# Patient Record
Sex: Male | Born: 1940 | Race: White | Hispanic: No | Marital: Single | Smoking: Current some day smoker
Health system: Southern US, Community
[De-identification: ages and names within clinical notes are randomized; demographics above are authoritative.]

## PROBLEM LIST (undated history)

## (undated) DIAGNOSIS — I10 Essential (primary) hypertension: Secondary | ICD-10-CM

## (undated) DIAGNOSIS — I4891 Unspecified atrial fibrillation: Secondary | ICD-10-CM

## (undated) DIAGNOSIS — C61 Malignant neoplasm of prostate: Secondary | ICD-10-CM

## (undated) HISTORY — PX: HERNIA REPAIR: SHX51

## (undated) HISTORY — PX: PAROTID GLAND TUMOR EXCISION: SHX5221

---

## 2016-09-27 ENCOUNTER — Emergency Department: Payer: Medicare HMO

## 2016-09-27 ENCOUNTER — Emergency Department
Admission: EM | Admit: 2016-09-27 | Discharge: 2016-09-27 | Disposition: A | Discharge status: Home / Self Care | Payer: Medicare HMO | Attending: Emergency Medicine | Admitting: Emergency Medicine

## 2016-09-27 ENCOUNTER — Encounter: Payer: Self-pay | Admitting: Emergency Medicine

## 2016-09-27 DIAGNOSIS — M62830 Muscle spasm of back: Secondary | ICD-10-CM

## 2016-09-27 DIAGNOSIS — R0602 Shortness of breath: Secondary | ICD-10-CM | POA: Diagnosis not present

## 2016-09-27 DIAGNOSIS — I10 Essential (primary) hypertension: Secondary | ICD-10-CM | POA: Diagnosis not present

## 2016-09-27 DIAGNOSIS — M545 Low back pain, unspecified: Secondary | ICD-10-CM

## 2016-09-27 DIAGNOSIS — J069 Acute upper respiratory infection, unspecified: Secondary | ICD-10-CM | POA: Diagnosis not present

## 2016-09-27 DIAGNOSIS — F17200 Nicotine dependence, unspecified, uncomplicated: Secondary | ICD-10-CM | POA: Insufficient documentation

## 2016-09-27 DIAGNOSIS — Z8546 Personal history of malignant neoplasm of prostate: Secondary | ICD-10-CM | POA: Diagnosis not present

## 2016-09-27 HISTORY — DX: Malignant neoplasm of prostate: C61

## 2016-09-27 HISTORY — DX: Essential (primary) hypertension: I10

## 2016-09-27 HISTORY — DX: Unspecified atrial fibrillation: I48.91

## 2016-09-27 MED ORDER — OXYCODONE-ACETAMINOPHEN 5-325 MG PO TABS: 1.0000 | ORAL_TABLET | Freq: Four times a day (QID) | ORAL | 0 refills | Status: AC | PRN

## 2016-09-27 MED ORDER — BACLOFEN 10 MG PO TABS: 10.0000 mg | ORAL_TABLET | Freq: Three times a day (TID) | ORAL | 0 refills | Status: AC | PRN

## 2016-09-27 MED ORDER — LORATADINE 10 MG PO TABS: 10.0000 mg | ORAL_TABLET | Freq: Every day | ORAL | 0 refills | Status: AC

## 2016-09-27 MED ORDER — KETOROLAC TROMETHAMINE 30 MG/ML IJ SOLN
30.0000 mg | Freq: Once | INTRAMUSCULAR | Status: AC
  Administered 2016-09-27: 30 mg via INTRAMUSCULAR
  Filled 2016-09-27: qty 1

## 2016-09-27 NOTE — ED Provider Notes (Signed)
[facility] Emergency Department Provider Note  ____________________________________________  Time seen: Approximately 11:05 AM  I have reviewed the triage vital signs and the nursing notes.   HISTORY  Chief Complaint Back Pain    HPI Nekhi Oen is a 75 y.o. male , NAD, presents to the emergency department with 1 week history of lower back pain. 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 Patient notes he has had right lower back pain over the last week that can radiate to the left. Has been taking Aleve over-the-counter which has not been helping.He stopped at an emergency department in [place] on his way to [facility] and was given hydrocodone. States that the pain worsens when he moves from lying to sitting and sitting to standing but then the pain decreases to a dull ache as he begins to ambulate. Denies any saddle paresthesias nor loss of bowel or bladder control. Has not noted any rashes or skin sores. Denies any radiation of pain. Has not had any injuries, traumas or falls. Denies any numbness, weakness, tingling. Also notes that he has had bronchitis over the last 2 weeks. States the symptoms began after he was playing golf got caught in a rainstorm and then sat in a friend's car whose air-conditioning was "stuck on high". Patient states he woke the next morning with cough and chest congestion and was seen by his primary care provider. His PCP started him on an antibiotic in which he is uncertain of the name, albuterol inhaler and prednisone in which she was taking 2 tablets by mouth daily over the last 10 days. States he finished his medications yesterday but still notes cough and chest congestion. He denies any fevers, chills nor general myalgias. Has not had any chest pain, palpitations. Denies wheezing. Has had no abdominal pain, nausea or vomiting. Denies loss of  consciousness, lightheadedness, dizziness or fatigue due to pain or cough. Has had no visual changes.   Past Medical History:  Diagnosis Date  . Atrial fibrillation ([place])   . Hypertension   . Prostate cancer ([place])     There are no active problems to display for this patient.   Past Surgical History:  Procedure Laterality Date  . HERNIA REPAIR    . PAROTID GLAND TUMOR EXCISION      Prior to Admission medications   Medication Sig Start Date End Date Taking? Authorizing Provider  baclofen (LIORESAL) 10 MG tablet Take 1 tablet (10 mg total) by mouth 3 (three) times daily as needed for muscle spasms. 09/27/16   Jami L Hagler, PA-C  loratadine (CLARITIN) 10 MG tablet Take 1 tablet (10 mg total) by mouth daily. 09/27/16   Jami L Hagler, PA-C  oxyCODONE-acetaminophen (ROXICET) 5-325 MG tablet Take 1 tablet by mouth every 6 (six) hours as needed. 09/27/16 09/27/17  Jami L Hagler, PA-C    Allergies Review of patient's allergies indicates no known allergies.  No family history on file.  Social History Social History  Substance Use Topics  . Smoking status: Current Some Day Smoker  . Smokeless tobacco: Never Used  . Alcohol use Yes     Comment: occasionally     Review of Systems  Constitutional: No fever/chills, Fatigue Eyes: No visual changes.  ENT: No sore throat, nasal congestion, runny nose, sinus pressure, ear pain. Cardiovascular: No chest pain, palpitations. Respiratory: Positive cough, chest congestion, shortness of breath. No wheezing.  Gastrointestinal:  No abdominal pain.  No nausea, vomiting.  No diarrhea.  Musculoskeletal: Positive for back pain. No neck or extremity pain. Skin: Negative for rash, skin sores. Neurological: Negative for headaches, focal weakness or numbness. No tingling. No saddle paresthesias nor loss of bowel or bladder control. No LOC, dizziness, lightheadedness. 10-point ROS otherwise  negative.  ____________________________________________   PHYSICAL EXAM:  VITAL SIGNS: ED Triage Vitals  Enc Vitals Group     BP 09/27/16 1045 130/62     Pulse Rate 09/27/16 1045 89     Resp 09/27/16 1045 18     Temp 09/27/16 1045 97.4 F (36.3 C)     Temp src --      SpO2 09/27/16 1045 93 %     Weight 09/27/16 1046 (!) 310 lb (140.6 kg)     Height 09/27/16 1046 5\' 10"  (1.778 m)     Head Circumference --      Peak Flow --      Pain Score 09/27/16 1047 7     Pain Loc --      Pain Edu? --      Excl. in [place]? --      Constitutional: Alert and oriented. Obese but well appearing and in no acute distress. Eyes: Conjunctivae are normal without icterus or injection.  Head: Atraumatic. ENT:      Ears: TMs visualized bilaterally without erythema, effusion, bulging, perforation. Bilateral external ear canals without swelling, erythema, discharge.      Nose: No congestion/rhinnorhea.      Mouth/Throat: Mucous membranes are moist. Pharynx without erythema, swelling, exudate. Uvula is midline. Moderate clear postnasal drip. Airways patent. Neck: No stridor. Supple with full range of motion. No cervical spine tenderness to palpation. Hematological/Lymphatic/Immunilogical: No cervical lymphadenopathy. Cardiovascular: Normal rate, regular rhythm. Normal S1 and S2.  No murmurs, rubs, gallops. Good peripheral circulation. Respiratory: Normal respiratory effort without tachypnea or retractions. Mild rhonchi noted throughout the right lung field without wheeze or rales. Breath sounds are noted in all lung fields. Gastrointestinal: Soft and nontender. No distention. No CVA tenderness. Musculoskeletal: No tenderness to palpation about the thoracic or lumbar midline spine. Mild tenderness to palpation about the right lower lateral muscular area about the lower lumbar region. Trace muscle spasm is noted in this area. Full range of motion of the lumbar spine is noted with minimal pain. Full range of  motion of bilateral upper and lower shin is without pain or difficulty. Neurologic:  Normal speech and language. No gross focal neurologic deficits are appreciated. Gait and posture are normal.  Skin:  Skin is warm, dry and intact. No rash, redness, swelling, skin sores noted. Psychiatric: Mood and affect are normal. Speech and behavior are normal. Patient exhibits appropriate insight and judgement.   ____________________________________________   LABS (all labs ordered are listed, but only abnormal results are displayed)  Labs Reviewed - No data to display ____________________________________________  EKG  None ____________________________________________  RADIOLOGY I, Braxton Feathers, personally viewed and evaluated these images (plain radiographs) as part of my medical decision making, as well as reviewing the written report by the radiologist.  Dg Chest 2 View  Result Date: 09/27/2016 CLINICAL DATA:  Nonproductive cough for 1 week. EXAM: CHEST  2 VIEW COMPARISON:  None. FINDINGS: Mild hyperinflation and generalized interstitial coarsening. Small scar or atelectatic like opacity at the left base. There is no edema, consolidation, effusion, or pneumothorax. Symmetric biapical pleural thickening. Normal heart size and negative mediastinal contours. Moderate compression fracture thoracolumbar junction, history suggesting  chronicity. 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 No acute superimposed finding. Electronically Signed   By: Monte Fantasia M.D.   On: 09/27/2016 11:32    ____________________________________________    PROCEDURES  Procedure(s) performed: None   Procedures   Medications  ketorolac (TORADOL) 30 MG/ML injection 30 mg (30 mg Intramuscular Given 09/27/16 1159)    ____________________________________________   INITIAL IMPRESSION / ASSESSMENT AND PLAN / ED COURSE  Pertinent labs & imaging results that were available during my  care of the patient were reviewed by me and considered in my medical decision making (see chart for details).  Clinical Course  Comment By Time  Imaging results were discussed with the patient. I do not feel he has a bacterial infection at this time. He does have an albuterol inhaler which she can use as needed. Considering he is just finished 10 days of prednisone at do not feel any further steroids are necessary. I believe the cough and congestion are just residual from recent illness and can be controlled with over-the-counter cough remedies as needed. Offered to prescribe Bromfed-DM to the patient but he declines at this time. Will prescribe an antihistamine which will hopefully help with postnasal drainage. Encouraged patient to keep all schedule appointments with his primary care providers and specialists in his home area. Braxton Feathers, PA-C 10/19 1223    Patient's diagnosis is consistent with URI, acute right-sided low back pain without sciatica and muscle spasm of back. Patient will be discharged home with prescriptions for baclofen, loratadine, Roxicet take as directed. Patient may continue to use albuterol inhaler as previously prescribed. Patient is to follow up with his primary care provider and specialists as currently scheduled in his hometown for further evaluation and treatment. Patient is given ED precautions to return to the ED for any worsening or new symptoms.    ____________________________________________  FINAL CLINICAL IMPRESSION(S) / ED DIAGNOSES  Final diagnoses:  Upper respiratory tract infection, unspecified type  Acute right-sided low back pain without sciatica  Muscle spasm of back      NEW MEDICATIONS STARTED DURING THIS VISIT:  New Prescriptions   BACLOFEN (LIORESAL) 10 MG TABLET    Take 1 tablet (10 mg total) by mouth 3 (three) times daily as needed for muscle spasms.   LORATADINE (CLARITIN) 10 MG TABLET    Take 1 tablet (10 mg total) by mouth daily.    OXYCODONE-ACETAMINOPHEN (ROXICET) 5-325 MG TABLET    Take 1 tablet by mouth every 6 (six) hours as needed.         Braxton Feathers, PA-C 09/27/16 [place], MD 09/27/16 [phone]

## 2016-09-27 NOTE — ED Triage Notes (Signed)
Patient presents to the ED with lower back pain x 1 week.  Patient reports being told his prior prostate cancer has metastasized to his back.  Patient states he has been taking vicodin and aleve without relief.  Patient also reports having run out of his inhaler and having some increased congestion.  Patient states, "I have a presentation tonight and I just want to feel better."  Patient states, "getting up to go somewhere is what really hurts, otherwise, it's like a dull ache."  Patient ambulatory to triage without obvious distress at this time.

## 2018-03-26 IMAGING — CR DG CHEST 2V
2 series · 2 of 2 positions shown · non-contrast
Comparison: None.

CLINICAL DATA: Nonproductive cough for 1 week.

EXAM:
CHEST  2 VIEW

[chest pa]
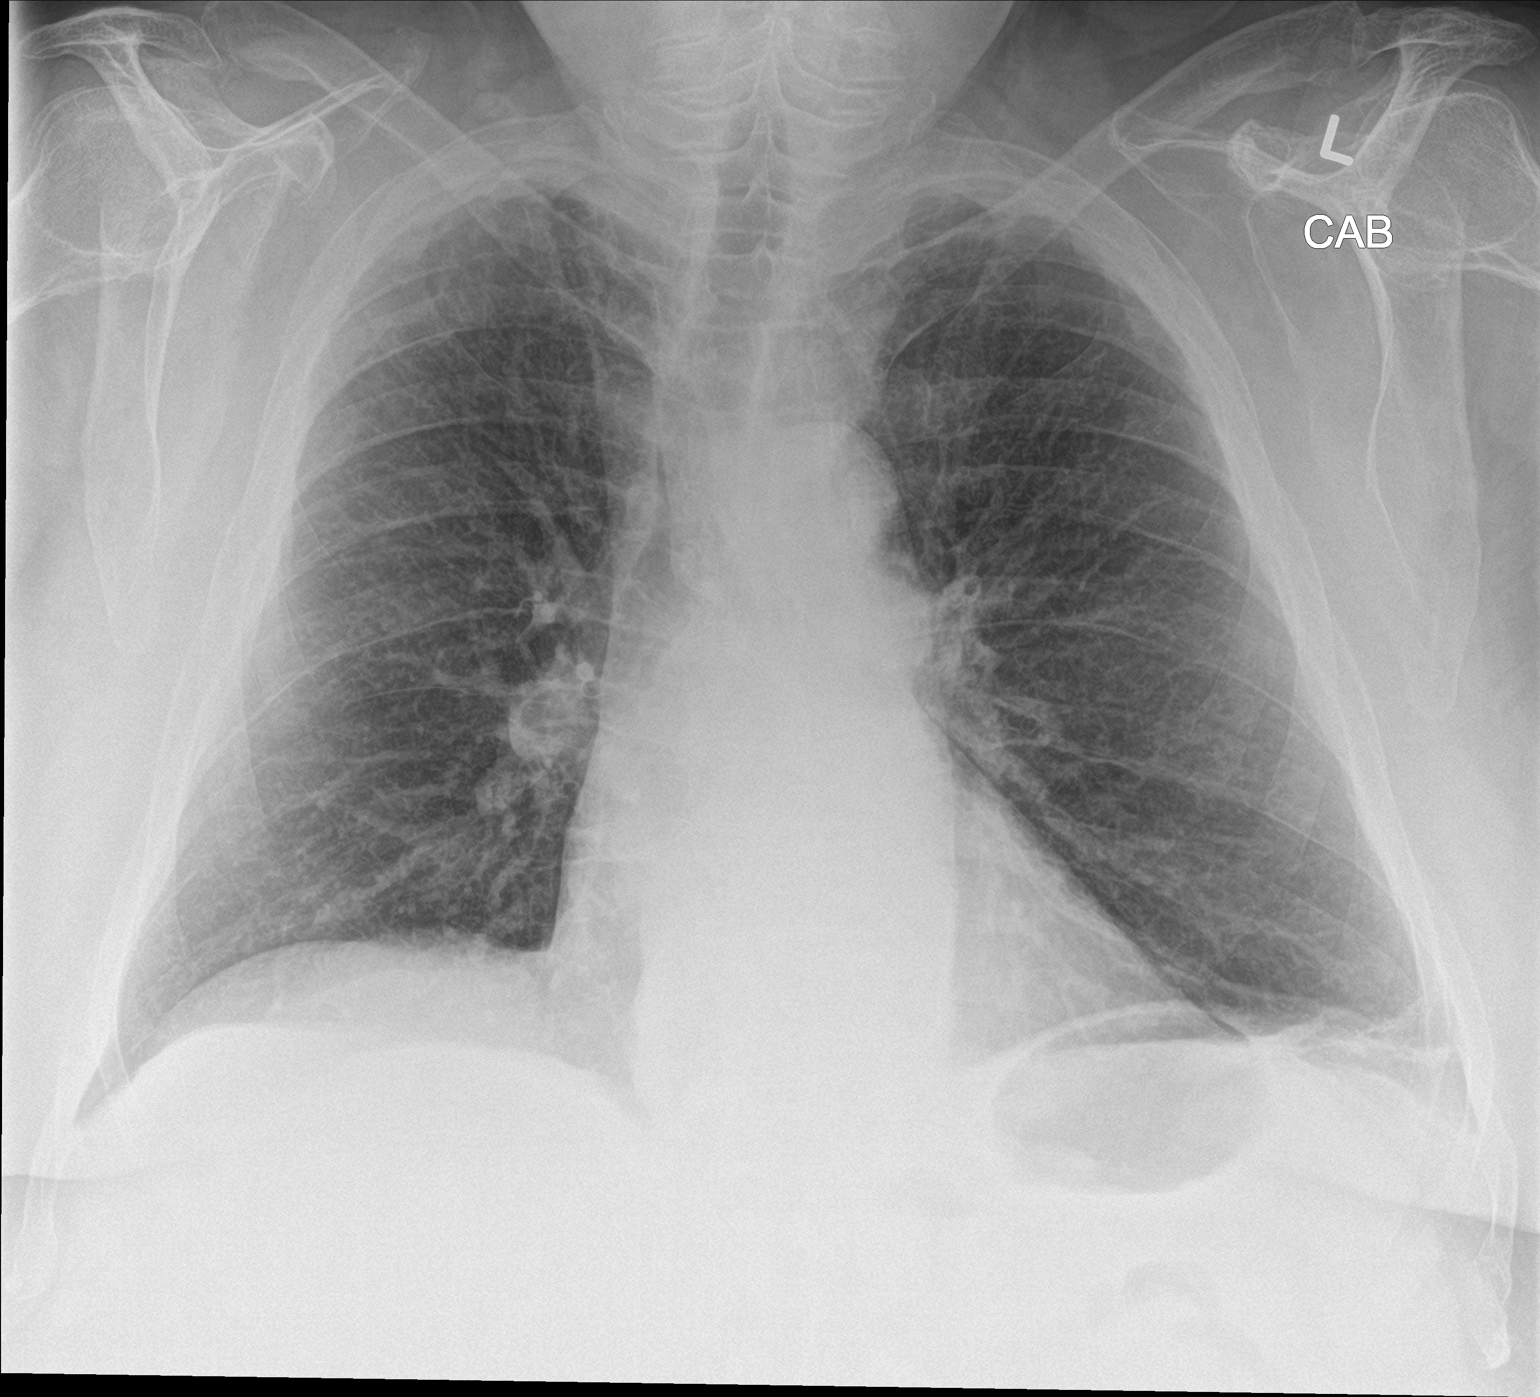

[chest lat]
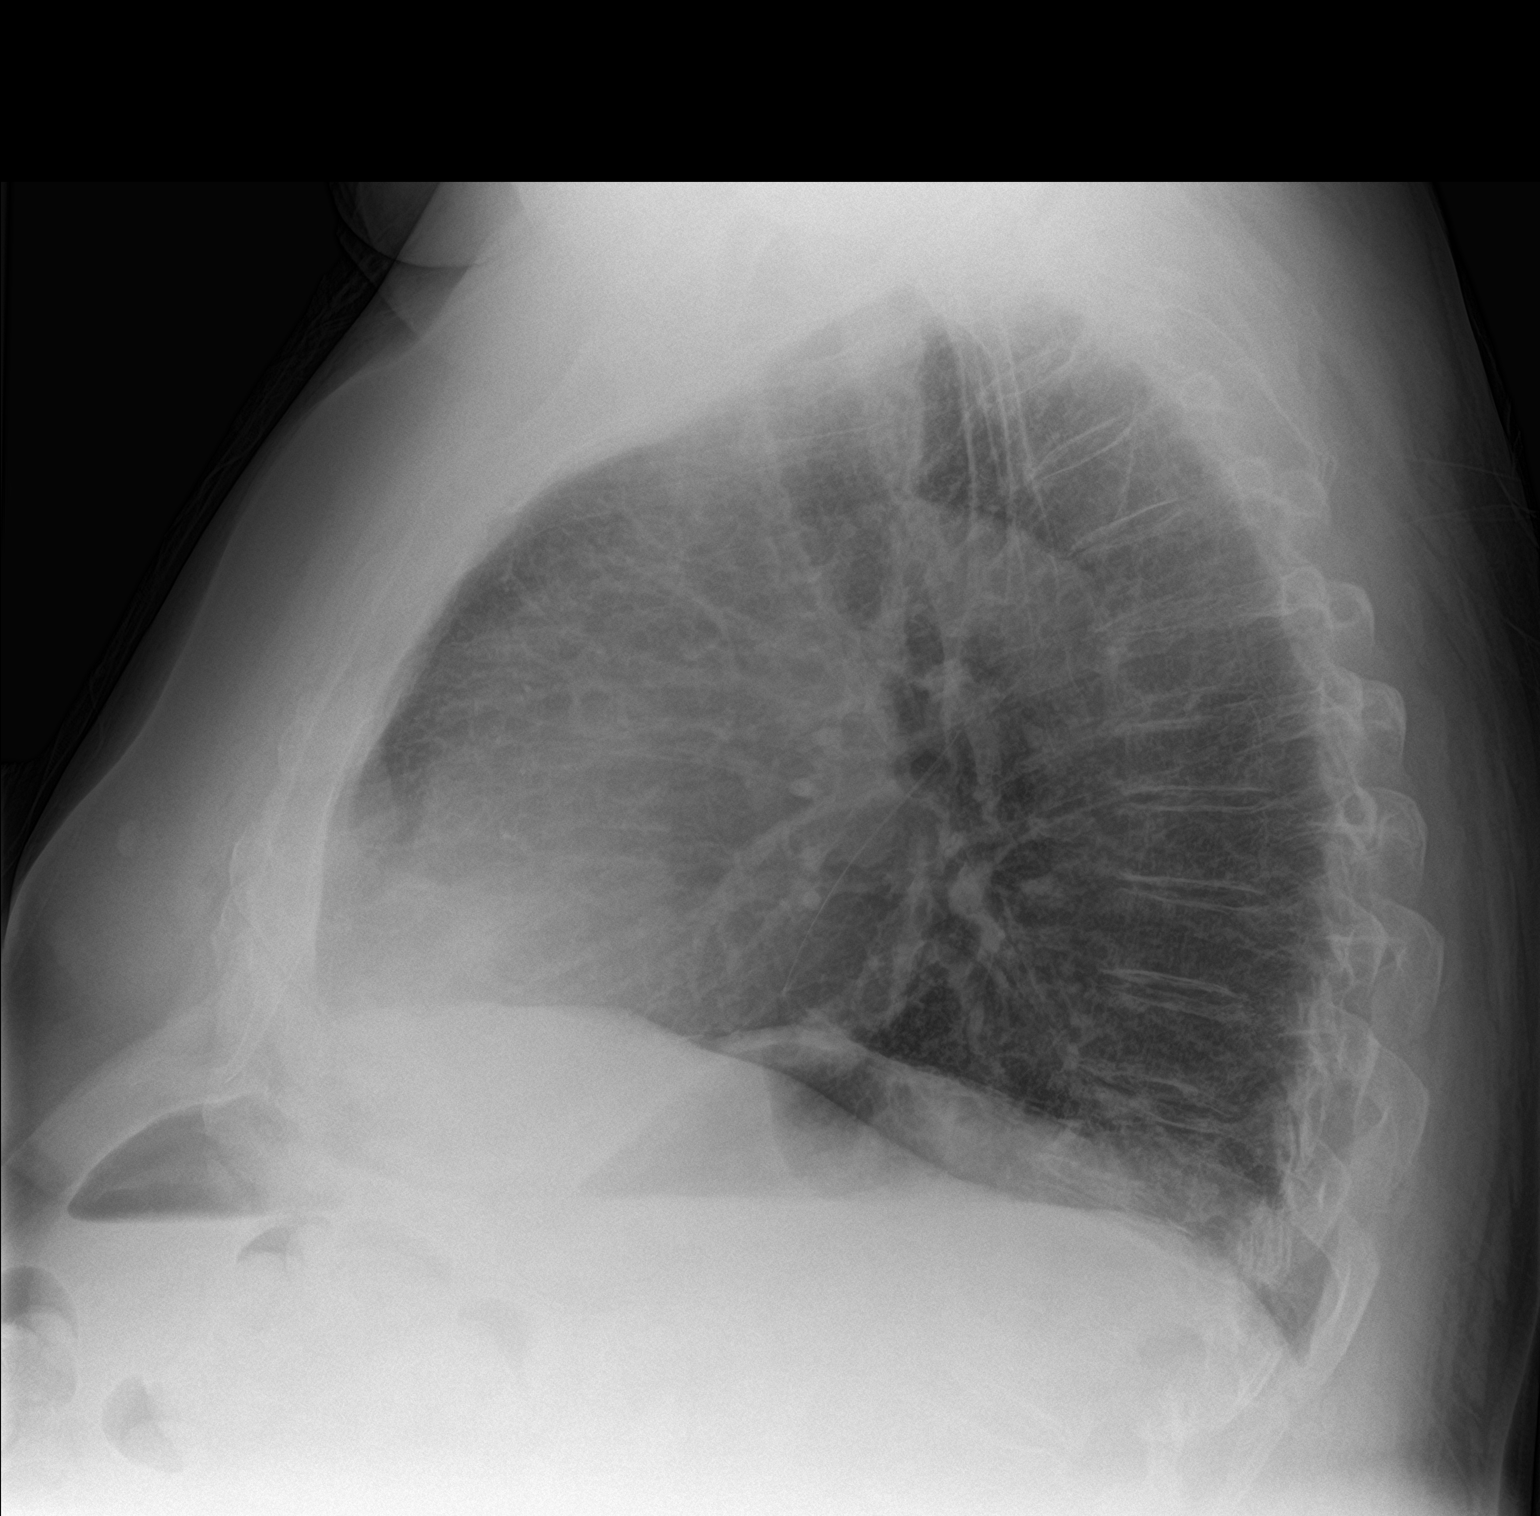

[2 of 2 positions shown; findings below may reference images not displayed]

FINDINGS: Mild hyperinflation and generalized interstitial coarsening. Small
scar or atelectatic like opacity at the left base. There is no
edema, consolidation, effusion, or pneumothorax. Symmetric biapical
pleural thickening. Normal heart size and negative mediastinal
contours. Moderate compression fracture thoracolumbar junction,
history suggesting chronicity.
IMPRESSION: Probable COPD with mild left basilar scar/atelectasis. No acute
superimposed finding.

## 2019-03-11 DEATH — deceased
